# Patient Record
Sex: Female | Born: 1995 | Race: Black or African American | Hispanic: No | Marital: Single | Smoking: Never smoker
Health system: Southern US, Community
[De-identification: ages and names within clinical notes are randomized; demographics above are authoritative.]

---

## 2016-08-07 ENCOUNTER — Encounter (HOSPITAL_COMMUNITY): Payer: Self-pay | Admitting: Emergency Medicine

## 2016-08-07 ENCOUNTER — Ambulatory Visit (HOSPITAL_COMMUNITY)
Admission: EM | Admit: 2016-08-07 | Discharge: 2016-08-07 | Disposition: A | Discharge status: Home / Self Care | Payer: Managed Care, Other (non HMO) | Attending: Emergency Medicine | Admitting: Emergency Medicine

## 2016-08-07 DIAGNOSIS — J029 Acute pharyngitis, unspecified: Secondary | ICD-10-CM | POA: Insufficient documentation

## 2016-08-07 LAB — POCT RAPID STREP A: STREPTOCOCCUS, GROUP A SCREEN (DIRECT): NEGATIVE

## 2016-08-07 NOTE — ED Provider Notes (Signed)
[facility]    CSN: [id number] date & time: 08/07/16  1819     History   Chief Complaint Chief Complaint  Patient presents with  . Sore Throat    HPI Brittany Robbins is a 20 y.o. female.   C/o sore throat and subjective fever for a few days. Denies N/V/D or rash.       History reviewed. No pertinent past medical history.  There are no active problems to display for this patient.   History reviewed. No pertinent surgical history.  OB History    No data available       Home Medications    Prior to Admission medications   Not on File    Family History History reviewed. No pertinent family history.  Social History Social History  Substance Use Topics  . Smoking status: Never Smoker  . Smokeless tobacco: Never Used  . Alcohol use No     Allergies   Patient has no known allergies.   Review of Systems Review of Systems  Constitutional: Positive for fever (subjective). Negative for chills.  HENT: Positive for sore throat. Negative for sinus pain and tinnitus.   Eyes: Negative for redness.  Respiratory: Negative for cough and shortness of breath.   Cardiovascular: Negative for chest pain and palpitations.  Gastrointestinal: Negative for abdominal pain, diarrhea, nausea and vomiting.  Genitourinary: Negative for dysuria, frequency and urgency.  Musculoskeletal: Negative for myalgias.  Skin: Negative for rash.       No lesions  Neurological: Negative for weakness.  Hematological: Does not bruise/bleed easily.  Psychiatric/Behavioral: Negative for suicidal ideas.     Physical Exam Triage Vital Signs ED Triage Vitals  Enc Vitals Group     BP 08/07/16 1840 148/80     Pulse Rate 08/07/16 1840 84     Resp 08/07/16 1840 16     Temp 08/07/16 1840 98.5 F (36.9 C)     Temp Source 08/07/16 1840 Oral     SpO2 08/07/16 1840 97 %     Weight --      Height --      Head Circumference --      Peak Flow --      Pain Score 08/07/16 1856  8     Pain Loc --      Pain Edu? --      Excl. in GC? --    No data found.   Updated Vital Signs BP 148/80 (BP Location: Left Arm)   Pulse 84   Temp 98.5 F (36.9 C) (Oral)   Resp 16   LMP 08/04/2016 (Exact Date)   SpO2 97%   Visual Acuity Right Eye Distance:   Left Eye Distance:   Bilateral Distance:    Right Eye Near:   Left Eye Near:    Bilateral Near:     Physical Exam  Constitutional: She is oriented to person, place, and time. She appears well-developed and well-nourished. No distress.  HENT:  Head: Normocephalic and atraumatic.  Mouth/Throat: Posterior oropharyngeal erythema present. No tonsillar exudate.  Eyes: Conjunctivae and EOM are normal. Pupils are equal, round, and reactive to light. No scleral icterus.  Neck: Normal range of motion. Neck supple. No JVD present. No tracheal deviation present. No thyromegaly present.  Cardiovascular: Normal rate, regular rhythm and normal heart sounds.  Exam reveals no gallop and no friction rub.   No murmur heard. Pulmonary/Chest: Effort normal and breath sounds normal.  Abdominal: Soft. Bowel sounds are normal. She  exhibits no distension. There is no tenderness.  Musculoskeletal: Normal range of motion. She exhibits no edema.  Lymphadenopathy:    She has no cervical adenopathy.  Neurological: She is alert and oriented to person, place, and time. No cranial nerve deficit.  Skin: Skin is warm and dry.  Psychiatric: She has a normal mood and affect. Her behavior is normal. Judgment and thought content normal.  Nursing note and vitals reviewed.    UC Treatments / Results  Labs (all labs ordered are listed, but only abnormal results are displayed) Labs Reviewed  POCT RAPID STREP A    EKG  EKG Interpretation None       Radiology No results found.  Procedures Procedures (including critical care time)  Medications Ordered in UC Medications - No data to display   Initial Impression / Assessment and Plan /  UC Course  I have reviewed the triage vital signs and the nursing notes.  Pertinent labs & imaging results that were available during my care of the patient were reviewed by me and considered in my medical decision making (see chart for details).  Clinical Course     Supportive care.  Throat culture pending  Final Clinical Impressions(s) / UC Diagnoses   Final diagnoses:  Viral pharyngitis    New Prescriptions New Prescriptions   No medications on file     Arnaldo NatalMichael S Sou Nohr, MD 09/05/16 [phone]

## 2016-08-07 NOTE — ED Triage Notes (Signed)
[facility] a [facility] sore [facility] and dysphagia x 2 days. The patient did report a fever at home this am but was not sure of the reading.

## 2016-08-09 ENCOUNTER — Telehealth (HOSPITAL_COMMUNITY): Payer: Self-pay | Admitting: Emergency Medicine

## 2016-08-09 LAB — CULTURE, GROUP A STREP (THRC)

## 2016-08-09 NOTE — Telephone Encounter (Signed)
Pt called back... Gave negative throat culture  Adv pt to take OTC chloraseptic or Cepacol lozenges and ibup/acetaminophen   Adv pt if not getting better to return or f/u w/PCP  Pt verb understating

## 2016-08-09 NOTE — Telephone Encounter (Signed)
Called [phone], no answer or VM Got a message from front staff to return pt's call.  Need to give lab results and to see how pt is doing from recent visit on 11/22 Strep culture was negative.

## 2016-08-11 ENCOUNTER — Encounter (HOSPITAL_COMMUNITY): Payer: Self-pay | Admitting: Emergency Medicine

## 2016-08-11 ENCOUNTER — Ambulatory Visit (HOSPITAL_COMMUNITY)
Admission: EM | Admit: 2016-08-11 | Discharge: 2016-08-11 | Disposition: A | Discharge status: Home / Self Care | Payer: Managed Care, Other (non HMO) | Attending: Family Medicine | Admitting: Family Medicine

## 2016-08-11 DIAGNOSIS — J029 Acute pharyngitis, unspecified: Secondary | ICD-10-CM | POA: Insufficient documentation

## 2016-08-11 DIAGNOSIS — B2710 Cytomegaloviral mononucleosis without complications: Secondary | ICD-10-CM | POA: Diagnosis not present

## 2016-08-11 LAB — POCT RAPID STREP A: Streptococcus, Group A Screen (Direct): NEGATIVE

## 2016-08-11 LAB — POCT INFECTIOUS MONO SCREEN: MONO SCREEN: POSITIVE — AB

## 2016-08-11 NOTE — Discharge Instructions (Signed)
Drink lots of fluids and use lozenges and rest as needed, return if needed.

## 2016-08-11 NOTE — ED Provider Notes (Signed)
[facility]    CSN: [id number] Arrival date & time: 08/11/16  1548     History   Chief Complaint Chief Complaint  Patient presents with  . Sore Throat    HPI Brittany Robbins is a 20 y.o. female.   The history is provided by the patient.  Sore Throat  This is a new problem. The current episode started more than 1 week ago ([facility], sx continue.). The problem has been gradually worsening. The symptoms are aggravated by swallowing.    History reviewed. No pertinent past medical history.  There are no active problems to display for this patient.   History reviewed. No pertinent surgical history.  OB History    No data available       Home Medications    Prior to Admission medications   Not on File    Family History No family history on file.  Social History Social History  Substance Use Topics  . Smoking status: Never Smoker  . Smokeless tobacco: Never Used  . Alcohol use No     Allergies   Patient has no known allergies.   Review of Systems Review of Systems  Constitutional: Negative.  Negative for fever.  HENT: Positive for sore throat. Negative for postnasal drip and rhinorrhea.   Respiratory: Negative.   Cardiovascular: Negative.   Gastrointestinal: Negative.      Physical Exam Triage Vital Signs ED Triage Vitals  Enc Vitals Group     BP 08/11/16 1638 126/68     Pulse Rate 08/11/16 1638 98     Resp 08/11/16 1638 16     Temp 08/11/16 1638 98.2 F (36.8 C)     Temp Source 08/11/16 1638 Temporal     SpO2 08/11/16 1638 100 %     Weight 08/11/16 1637 230 lb (104.3 kg)     Height 08/11/16 1637 5\' 2"  (1.575 m)     Head Circumference --      Peak Flow --      Pain Score 08/11/16 1639 9     Pain Loc --      Pain Edu? --      Excl. in GC? --    No data found.   Updated Vital Signs BP 126/68   Pulse 98   Temp 98.2 F (36.8 C) (Temporal)   Resp 16   Ht 5\' 2"  (1.575 m)   Wt 230 lb (104.3 kg)   LMP  08/04/2016 (Exact Date)   SpO2 100%   BMI 42.07 kg/m   Visual Acuity Right Eye Distance:   Left Eye Distance:   Bilateral Distance:    Right Eye Near:   Left Eye Near:    Bilateral Near:     Physical Exam  Constitutional: She is oriented to person, place, and time. She appears well-developed and well-nourished. No distress.  HENT:  Right Ear: External ear normal.  Left Ear: External ear normal.  Mouth/Throat: Uvula is midline and mucous membranes are normal. Posterior oropharyngeal erythema present. No oropharyngeal exudate or tonsillar abscesses. Tonsils are 3+ on the right. Tonsils are 3+ on the left. No tonsillar exudate.  Neck: Normal range of motion. Neck supple.  Cardiovascular: Normal rate.   Pulmonary/Chest: Effort normal.  Lymphadenopathy:    She has no cervical adenopathy.  Neurological: She is alert and oriented to person, place, and time.  Skin: Skin is warm and dry.  Nursing note and vitals reviewed.    UC Treatments /  Results  Labs (all labs ordered are listed, but only abnormal results are displayed) Labs Reviewed  POCT RAPID STREP A  strep neg Mono pos.  EKG  EKG Interpretation None       Radiology No results found.  Procedures Procedures (including critical care time)  Medications Ordered in UC Medications - No data to display   Initial Impression / Assessment and Plan / UC Course  I have reviewed the triage vital signs and the nursing notes.  Pertinent labs & imaging results that were available during my care of the patient were reviewed by me and considered in my medical decision making (see chart for details).  Clinical Course       Final Clinical Impressions(s) / UC Diagnoses   Final diagnoses:  None    New Prescriptions New Prescriptions   No medications on file     Linna HoffJames D Raylene Carmickle, MD 08/11/16 [id number]

## 2016-08-11 NOTE — ED Triage Notes (Signed)
PT reports severe sore throat for 2 weeks. PT was seen here last week and strep was negative. PT reports symptoms have progressed.

## 2016-08-13 LAB — CULTURE, GROUP A STREP (THRC)

## 2016-12-31 ENCOUNTER — Emergency Department (HOSPITAL_COMMUNITY): Payer: Managed Care, Other (non HMO)

## 2016-12-31 ENCOUNTER — Encounter (HOSPITAL_COMMUNITY): Payer: Self-pay | Admitting: Emergency Medicine

## 2016-12-31 ENCOUNTER — Emergency Department (HOSPITAL_COMMUNITY)
Admission: EM | Admit: 2016-12-31 | Discharge: 2016-12-31 | Disposition: A | Discharge status: Home / Self Care | Payer: Managed Care, Other (non HMO) | Attending: Emergency Medicine | Admitting: Emergency Medicine

## 2016-12-31 DIAGNOSIS — R102 Pelvic and perineal pain: Secondary | ICD-10-CM | POA: Diagnosis not present

## 2016-12-31 LAB — COMPREHENSIVE METABOLIC PANEL
ALT: 12 U/L — ABNORMAL LOW (ref 14–54)
AST: 19 U/L (ref 15–41)
Albumin: 3.6 g/dL (ref 3.5–5.0)
Alkaline Phosphatase: 58 U/L (ref 38–126)
Anion gap: 7 (ref 5–15)
BUN: 11 mg/dL (ref 6–20)
CO2: 26 mmol/L (ref 22–32)
Calcium: 9.1 mg/dL (ref 8.9–10.3)
Chloride: 106 mmol/L (ref 101–111)
Creatinine, Ser: 0.87 mg/dL (ref 0.44–1.00)
GFR calc Af Amer: >60 mL/min (ref >60)
GFR calc non Af Amer: >60 mL/min (ref >60)
Glucose, Bld: 108 mg/dL — ABNORMAL HIGH (ref 65–99)
Potassium: 3.7 mmol/L (ref 3.5–5.1)
Sodium: 139 mmol/L (ref 135–145)
Total Bilirubin: 0.3 mg/dL (ref 0.3–1.2)
Total Protein: 6.3 g/dL — ABNORMAL LOW (ref 6.5–8.1)

## 2016-12-31 LAB — URINALYSIS, ROUTINE W REFLEX MICROSCOPIC
Bacteria, UA: NONE SEEN
Bilirubin Urine: NEGATIVE
GLUCOSE, UA: NEGATIVE mg/dL
Ketones, ur: NEGATIVE mg/dL
Leukocytes, UA: NEGATIVE
Nitrite: NEGATIVE
PH: 5 (ref 5.0–8.0)
Protein, ur: NEGATIVE mg/dL
Specific Gravity, Urine: 1.03 (ref 1.005–1.030)

## 2016-12-31 LAB — CBC WITH DIFFERENTIAL/PLATELET
Basophils Absolute: 0 10*3/uL (ref 0.0–0.1)
Basophils Relative: 0 %
Eosinophils Absolute: 0.1 10*3/uL (ref 0.0–0.7)
Eosinophils Relative: 1 %
HCT: 32.3 % — ABNORMAL LOW (ref 36.0–46.0)
Hemoglobin: 10.1 g/dL — ABNORMAL LOW (ref 12.0–15.0)
Lymphocytes Relative: 35 %
Lymphs Abs: 2.8 10*3/uL (ref 0.7–4.0)
MCH: 22.3 pg — ABNORMAL LOW (ref 26.0–34.0)
MCHC: 31.3 g/dL (ref 30.0–36.0)
MCV: 71.5 fL — ABNORMAL LOW (ref 78.0–100.0)
Monocytes Absolute: 0.5 10*3/uL (ref 0.1–1.0)
Monocytes Relative: 6 %
Neutro Abs: 4.7 10*3/uL (ref 1.7–7.7)
Neutrophils Relative %: 58 %
Platelets: 315 10*3/uL (ref 150–400)
RBC: 4.52 MIL/uL (ref 3.87–5.11)
RDW: 16.1 % — ABNORMAL HIGH (ref 11.5–15.5)
WBC: 8 10*3/uL (ref 4.0–10.5)

## 2016-12-31 LAB — RPR: RPR Ser Ql: NONREACTIVE

## 2016-12-31 LAB — HIV ANTIBODY (ROUTINE TESTING W REFLEX): HIV Screen 4th Generation wRfx: NONREACTIVE

## 2016-12-31 LAB — GC/CHLAMYDIA PROBE AMP (~~LOC~~) NOT AT ARMC
Chlamydia: NEGATIVE
Neisseria Gonorrhea: NEGATIVE

## 2016-12-31 LAB — WET PREP, GENITAL
Clue Cells Wet Prep HPF POC: NONE SEEN
Sperm: NONE SEEN
Trich, Wet Prep: NONE SEEN
Yeast Wet Prep HPF POC: NONE SEEN

## 2016-12-31 LAB — POC URINE PREG, ED: Preg Test, Ur: NEGATIVE

## 2016-12-31 MED ORDER — FERROUS SULFATE 325 (65 FE) MG PO TABS: 325.0000 mg | ORAL_TABLET | Freq: Every day | ORAL | 0 refills | Status: AC

## 2016-12-31 MED ORDER — IBUPROFEN 800 MG PO TABS: 800.0000 mg | ORAL_TABLET | Freq: Three times a day (TID) | ORAL | 0 refills | Status: AC

## 2016-12-31 MED ORDER — DOCUSATE SODIUM 250 MG PO CAPS: 250.0000 mg | ORAL_CAPSULE | Freq: Every day | ORAL | 0 refills | Status: AC

## 2016-12-31 MED ORDER — ACETAMINOPHEN 500 MG PO TABS: 1000.0000 mg | ORAL_TABLET | Freq: Once | ORAL | Status: DC

## 2016-12-31 NOTE — ED Notes (Signed)
Patient transported to Ultrasound 

## 2016-12-31 NOTE — ED Triage Notes (Signed)
Pt presents with generalized abd pain and pelvic pain that woke her from sleep this morning; pt states LPM - now; usually takes advil and pain goes away but not now; pt denies n/v/d, fever, urinary symptoms

## 2016-12-31 NOTE — ED Provider Notes (Signed)
[facility]   CSN: [id number] Arrival date & time: 12/31/16  4098     History   Chief Complaint Chief Complaint  Patient presents with  . Abdominal Pain  . Pelvic Pain    HPI Brittany Robbins is a 21 y.o. female who is previously healthy who presents with acute onset lower abdominal and pelvic pain that woke the patient up around 4 AM. Patient describes the pain is severe cramping with intermittent sharp pains. Patient is currently on her menstrual period started 2 days ago. Patient states she does not normally have pain like this that is unresolved with ibuprofen. Patient took ibuprofen around 4 AM. Patient states her menstrual cycle started a week early. Patient states her period is pretty regular, but heavy. Patient denies any abnormal vaginal bleeding or discharge prior. She denies any fevers, chest pain, shortness of breath, nausea, vomiting, urinary symptoms.  HPI  History reviewed. No pertinent past medical history.  There are no active problems to display for this patient.   History reviewed. No pertinent surgical history.  OB History    No data available       Home Medications    Prior to Admission medications   Medication Sig Start Date End Date Taking? Authorizing Provider  docusate sodium (COLACE) 250 MG capsule Take 1 capsule (250 mg total) by mouth daily. 12/31/16   Emi Holes, PA-C  ferrous sulfate 325 (65 FE) MG tablet Take 1 tablet (325 mg total) by mouth daily. 12/31/16   Emi Holes, PA-C  ibuprofen (ADVIL,MOTRIN) 800 MG tablet Take 1 tablet (800 mg total) by mouth 3 (three) times daily. 12/31/16   Emi Holes, PA-C    Family History History reviewed. No pertinent family history.  Social History Social History  Substance Use Topics  . Smoking status: Never Smoker  . Smokeless tobacco: Never Used  . Alcohol use Yes     Comment: socially     Allergies   Patient has no known allergies.   Review of Systems Review  of Systems  Constitutional: Negative for chills and fever.  HENT: Negative for facial swelling and sore throat.   Respiratory: Negative for shortness of breath.   Cardiovascular: Negative for chest pain.  Gastrointestinal: Negative for abdominal pain, nausea and vomiting.  Genitourinary: Positive for pelvic pain and vaginal bleeding. Negative for dysuria.  Musculoskeletal: Negative for back pain.  Skin: Negative for rash and wound.  Neurological: Negative for headaches.  Psychiatric/Behavioral: The patient is not nervous/anxious.      Physical Exam Updated Vital Signs BP 114/65   Pulse 81   Temp 97.7 F (36.5 C) (Oral)   Resp 16   Ht  (1.6 m)   Wt 104.3 kg   LMP 12/29/2016   SpO2 100%   BMI 40.74 kg/m   Physical Exam  Constitutional: She appears well-developed and well-nourished. No distress.  HENT:  Head: Normocephalic and atraumatic.  Mouth/Throat: Oropharynx is clear and moist. No oropharyngeal exudate.  Eyes: Conjunctivae are normal. Pupils are equal, round, and reactive to light. Right eye exhibits no discharge. Left eye exhibits no discharge. No scleral icterus.  Neck: Normal range of motion. Neck supple. No thyromegaly present.  Cardiovascular: Normal rate, regular rhythm, normal heart sounds and intact distal pulses.  Exam reveals no gallop and no friction rub.   No murmur heard. Pulmonary/Chest: Effort normal and breath sounds normal. No stridor. No respiratory distress. She has no wheezes. She has no rales.  Abdominal:  Soft. Bowel sounds are normal. She exhibits no distension. There is tenderness in the right lower quadrant, suprapubic area and left lower quadrant. There is no rebound and no guarding.    Genitourinary: Uterus is tender. Cervix exhibits no motion tenderness. Right adnexum displays no mass, no tenderness and no fullness. Left adnexum displays no mass, no tenderness and no fullness. There is bleeding in the vagina.  Genitourinary Comments:  Chaperone present  Musculoskeletal: She exhibits no edema.  Lymphadenopathy:    She has no cervical adenopathy.  Neurological: She is alert. Coordination normal.  Skin: Skin is warm and dry. No rash noted. She is not diaphoretic. No pallor.  Psychiatric: She has a normal mood and affect.  Nursing note and vitals reviewed.    ED Treatments / Results  Labs (all labs ordered are listed, but only abnormal results are displayed) Labs Reviewed  WET PREP, GENITAL - Abnormal; Notable for the following:       Result Value   WBC, Wet Prep HPF POC MANY (*)    All other components within normal limits  URINALYSIS, ROUTINE W REFLEX MICROSCOPIC - Abnormal; Notable for the following:    Hgb urine dipstick MODERATE (*)    Squamous Epithelial / LPF 0-5 (*)    All other components within normal limits  CBC WITH DIFFERENTIAL/PLATELET - Abnormal; Notable for the following:    Hemoglobin 10.1 (*)    HCT 32.3 (*)    MCV 71.5 (*)    MCH 22.3 (*)    RDW 16.1 (*)    All other components within normal limits  COMPREHENSIVE METABOLIC PANEL - Abnormal; Notable for the following:    Glucose, Bld 108 (*)    Total Protein 6.3 (*)    ALT 12 (*)    All other components within normal limits  RPR  HIV ANTIBODY (ROUTINE TESTING)  POC URINE PREG, ED  GC/CHLAMYDIA PROBE AMP ([place]) NOT AT [facility]    EKG  EKG Interpretation None       Radiology US Transvaginal Non-ob  Result Date: 12/31/2016 CLINICAL DATA:  Generalized abdominal and pelvic pain. EXAM: TRANSABDOMINAL AND TRANSVAGINAL ULTRASOUND OF PELVIS DOPPLER ULTRASOUND OF OVARIES TECHNIQUE: Both transabdominal and transvaginal ultrasound examinations of the pelvis were performed. Transabdominal technique was performed for global imaging of the pelvis including uterus, ovaries, adnexal regions, and pelvic cul-de-sac. It was necessary to proceed with endovaginal exam following the transabdominal exam to visualize the endometrium. Color and duplex  Doppler ultrasound was utilized to evaluate blood flow to the ovaries. COMPARISON:  None. FINDINGS: Uterus Measurements: 8.2 x 3.4 x 4.8 cm. No fibroids or other mass visualized. Endometrium Thickness: 1.3 mm.  Fluid identified within the endometrial cavity. Right ovary Measurements: 2.9 x 1.5 x 4.1 cm. Normal appearance/no adnexal mass. Left ovary Measurements: 2.5 x 2.5 x 3.1 cm. Normal appearance/no adnexal mass. Pulsed Doppler evaluation of both ovaries demonstrates normal low-resistance arterial and venous waveforms. Other findings No abnormal free fluid. IMPRESSION: Normal pelvic sonogram.  No evidence for ovarian torsion. Electronically Signed   By: Signa Kell M.D.   On: 12/31/2016 09:01   US Pelvis Complete  Result Date: 12/31/2016 CLINICAL DATA:  Generalized abdominal and pelvic pain. EXAM: TRANSABDOMINAL AND TRANSVAGINAL ULTRASOUND OF PELVIS DOPPLER ULTRASOUND OF OVARIES TECHNIQUE: Both transabdominal and transvaginal ultrasound examinations of the pelvis were performed. Transabdominal technique was performed for global imaging of the pelvis including uterus, ovaries, adnexal regions, and pelvic cul-de-sac. It was necessary to proceed with endovaginal exam following  the transabdominal exam to visualize the endometrium. Color and duplex Doppler ultrasound was utilized to evaluate blood flow to the ovaries. COMPARISON:  None. FINDINGS: Uterus Measurements: 8.2 x 3.4 x 4.8 cm. No fibroids or other mass visualized. Endometrium Thickness: 1.3 mm.  Fluid identified within the endometrial cavity. Right ovary Measurements: 2.9 x 1.5 x 4.1 cm. Normal appearance/no adnexal mass. Left ovary Measurements: 2.5 x 2.5 x 3.1 cm. Normal appearance/no adnexal mass. Pulsed Doppler evaluation of both ovaries demonstrates normal low-resistance arterial and venous waveforms. Other findings No abnormal free fluid. IMPRESSION: Normal pelvic sonogram.  No evidence for ovarian torsion. Electronically Signed   By: Signa Kell M.D.   On: 12/31/2016 09:01   [place] Art/ven Flow Abd Pelv Doppler  Result Date: 12/31/2016 CLINICAL DATA:  Generalized abdominal and pelvic pain. EXAM: TRANSABDOMINAL AND TRANSVAGINAL ULTRASOUND OF PELVIS DOPPLER ULTRASOUND OF OVARIES TECHNIQUE: Both transabdominal and transvaginal ultrasound examinations of the pelvis were performed. Transabdominal technique was performed for global imaging of the pelvis including uterus, ovaries, adnexal regions, and pelvic cul-de-sac. It was necessary to proceed with endovaginal exam following the transabdominal exam to visualize the endometrium. Color and duplex Doppler ultrasound was utilized to evaluate blood flow to the ovaries. COMPARISON:  None. FINDINGS: Uterus Measurements: 8.2 x 3.4 x 4.8 cm. No fibroids or other mass visualized. Endometrium Thickness: 1.3 mm.  Fluid identified within the endometrial cavity. Right ovary Measurements: 2.9 x 1.5 x 4.1 cm. Normal appearance/no adnexal mass. Left ovary Measurements: 2.5 x 2.5 x 3.1 cm. Normal appearance/no adnexal mass. Pulsed Doppler evaluation of both ovaries demonstrates normal low-resistance arterial and venous waveforms. Other findings No abnormal free fluid. IMPRESSION: Normal pelvic sonogram.  No evidence for ovarian torsion. Electronically Signed   By: Signa Kell M.D.   On: 12/31/2016 09:01    Procedures Procedures (including critical care time)  Medications Ordered in ED Medications  acetaminophen (TYLENOL) tablet 1,000 mg (not administered)     Initial Impression / Assessment and Plan / ED Course  I have reviewed the triage vital signs and the nursing notes.  Pertinent labs & imaging results that were available during my care of the patient were reviewed by me and considered in my medical decision making (see chart for details).     Patient with probable severe menstrual cramps. Patient's symptoms resolving in the ED without intervention. Patient given Tylenol for residual pain. CBC  shows hemoglobin 10.1, microcytic anemia. CMP shows glucose 108, protein 6.3, ALT 12. GC/chlamydia, HIV, RPR sent. Wet prep shows many WBCs. Urine pregnancy negative. Urine shows moderate hematuria, however patient is currently on the initial cycle. Pelvic ultrasound normal, no evidence for torsion or other findings. Doubt any other emergent etiology. Pain mostly localized to suprapubic area with pelvic exam exhibiting uterine tenderness, but no adnexal tenderness. No CMT concerning for PID. No back pain or urinary symptoms indicating kidney stone or ureteral stone. Follow-up to OB/GYN. Discharge home with ibuprofen. Advised patient can alternate with Tylenol. Also initiated iron supplementation with Colace. Strict return precautions discussed. Patient understands and agrees with plan. Patient vitals stable throughout ED course and discharged in satisfactory condition. I discussed patient case with Dr. Patria Mane who got the patient's management and agrees with plan.  Final Clinical Impressions(s) / ED Diagnoses   Final diagnoses:  Pelvic pain in female    New Prescriptions New Prescriptions   DOCUSATE SODIUM (COLACE) 250 MG CAPSULE    Take 1 capsule (250 mg total) by mouth daily.   FERROUS  SULFATE 325 (65 FE) MG TABLET    Take 1 tablet (325 mg total) by mouth daily.   IBUPROFEN (ADVIL,MOTRIN) 800 MG TABLET    Take 1 tablet (800 mg total) by mouth 3 (three) times daily.     Emi Holes, PA-C 12/31/16 1005    Azalia Bilis, MD 01/01/17 1550

## 2016-12-31 NOTE — Discharge Instructions (Signed)
Medications: Ibuprofen, ferrous sulfate, Colace  Treatment: Take ibuprofen every 8 hours. You can alternate with Tylenol as prescribed over-the-counter. Take iron (ferrous sulfate) once daily as prescribed. Take Colace with this medication to help prevent constipation. Iron may make your stool darker than normal. You will be called in 2-3 days if you test positive for any sexual transmitted diseases. You will need to follow-up with an OB/GYN or the health department for treatment if positive. You should also tell your sexual partners of any positive results as well so they can be treated.  Follow-up: Please follow-up and establish care with the women's outpatient clinic or another OB/GYN (see circled number outlined in black on your discharge paperwork for a number to help you find a doctor) for further evaluation and treatment. Please return to emergency department if you develop any new or worsening symptoms.

## 2017-04-27 IMAGING — US US PELVIS COMPLETE
1 series · 14 of 25 positions shown · non-contrast
Comparison: None.

CLINICAL DATA: Generalized abdominal and pelvic pain.

EXAM:
TRANSABDOMINAL AND TRANSVAGINAL ULTRASOUND OF PELVIS
DOPPLER ULTRASOUND OF OVARIES
TECHNIQUE: Both transabdominal and transvaginal ultrasound examinations of the
pelvis were performed. Transabdominal technique was performed for
global imaging of the pelvis including uterus, ovaries, adnexal
regions, and pelvic cul-de-sac.
It was necessary to proceed with endovaginal exam following the
transabdominal exam to visualize the endometrium. Color and duplex
Doppler ultrasound was utilized to evaluate blood flow to the
ovaries.

[Series 1: us pelvis complete · 0.21mm/px · 14 of 57 slices shown]
[im 1/57]
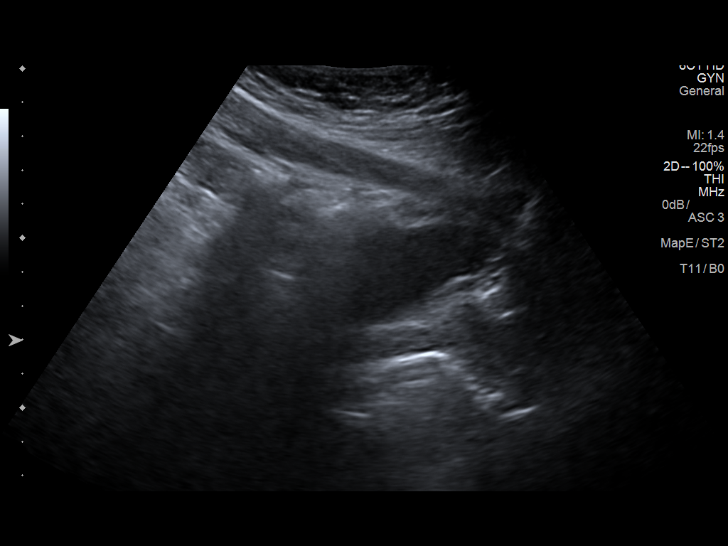
[im 5/57]
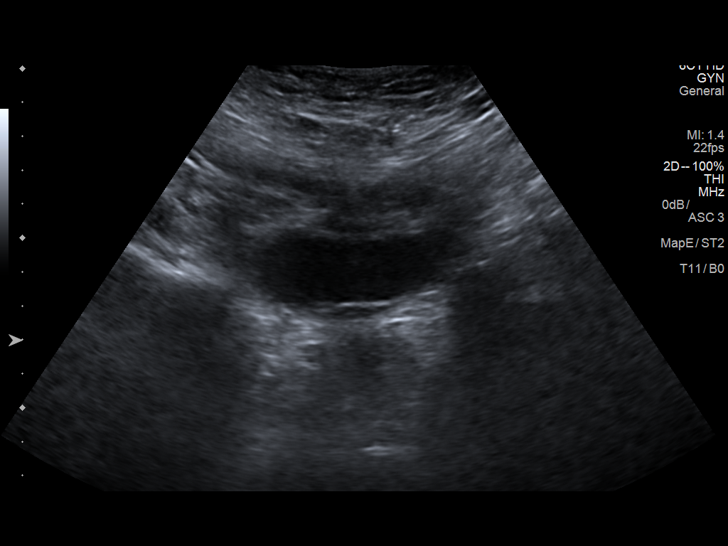
[im 10/57]
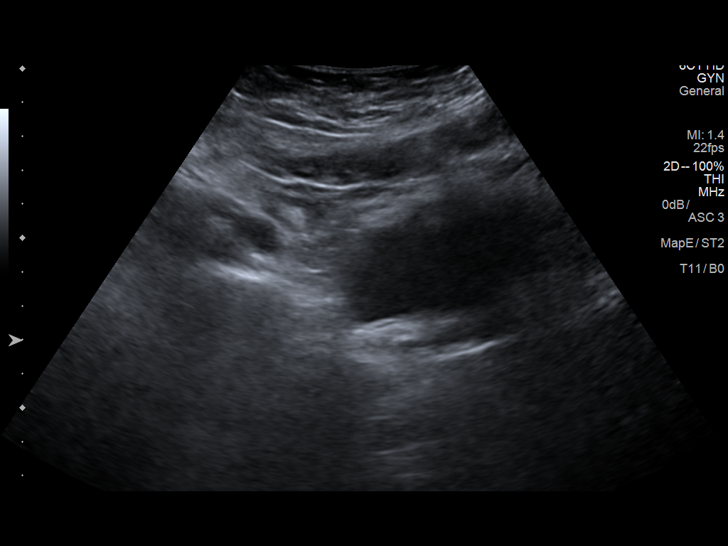
[im 15/57]
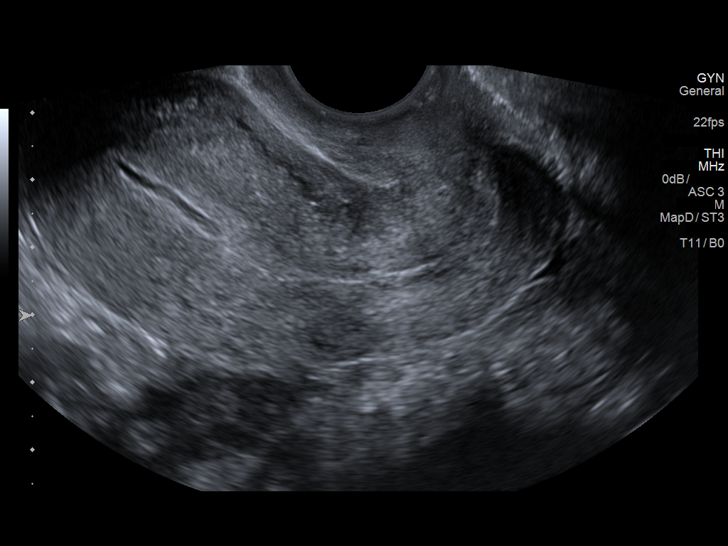
[im 19/57]
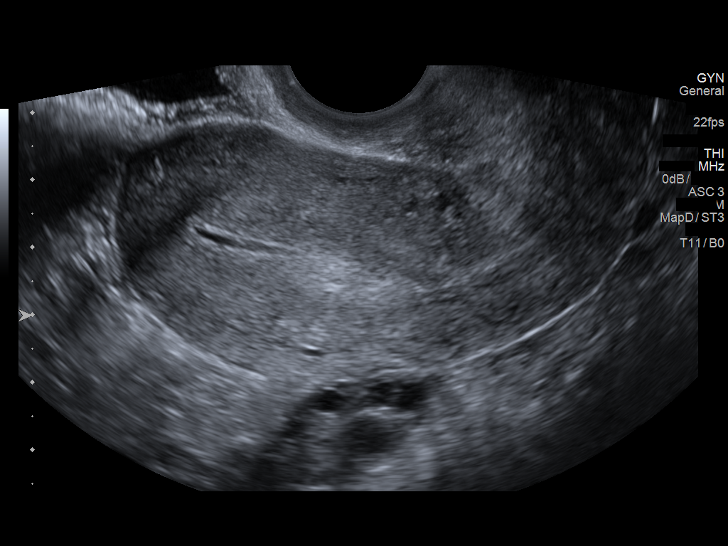
[im 22/57]
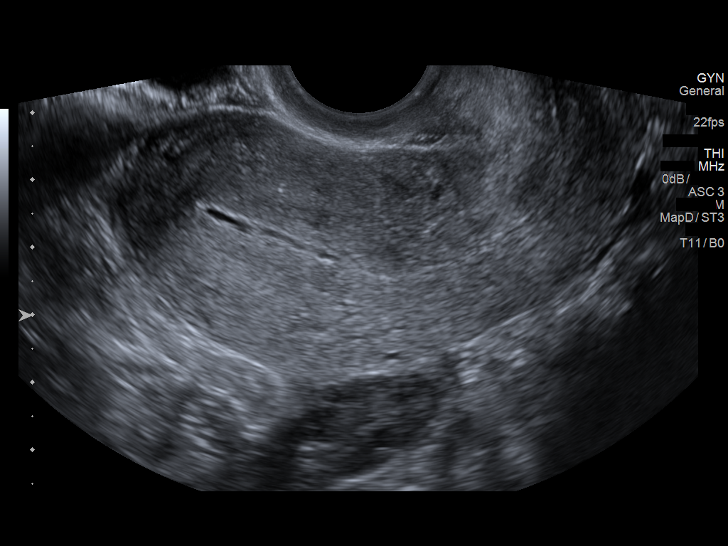
[im 26/57]
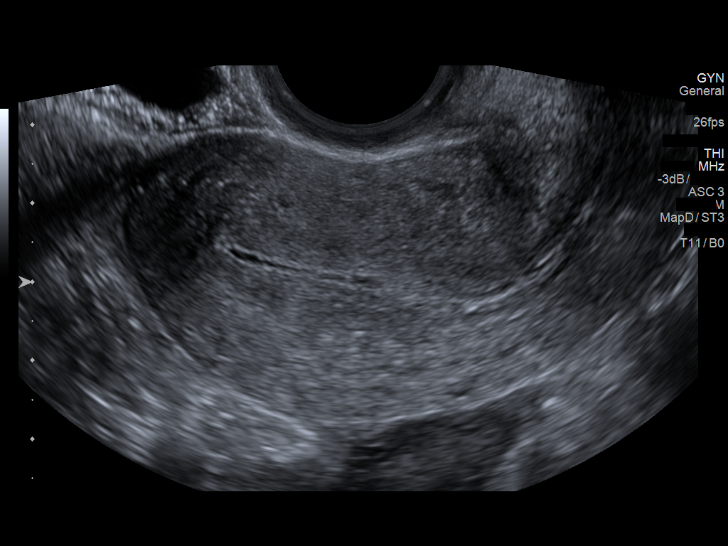
[im 31/57]
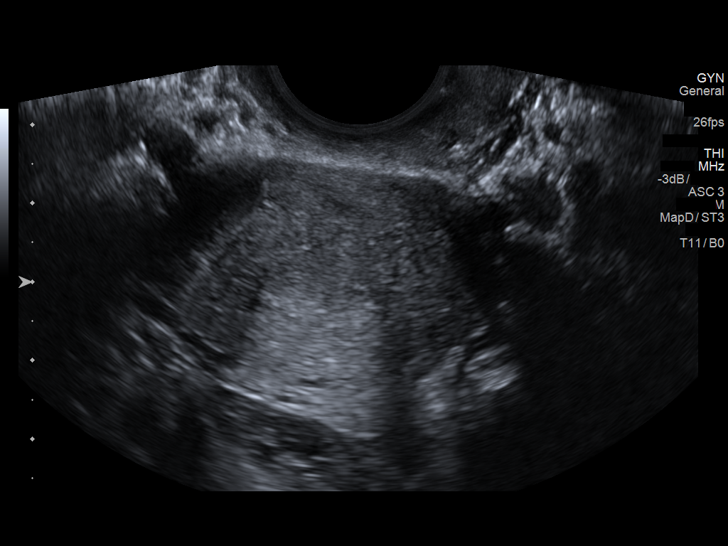
[im 36/57]
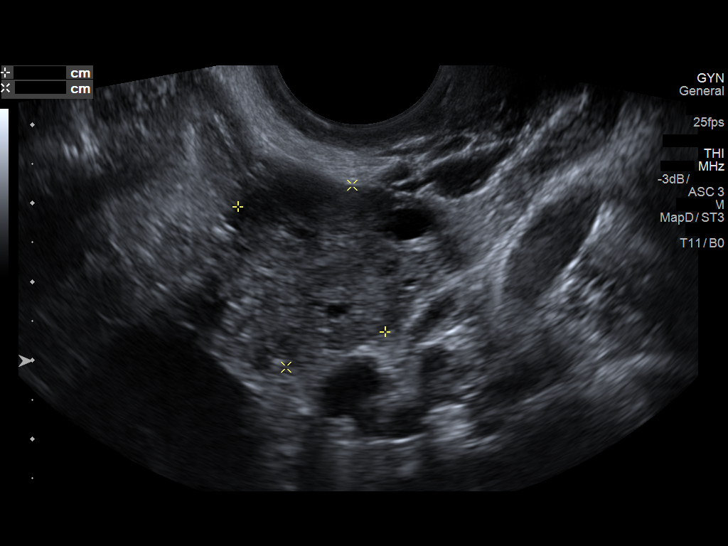
[im 38/57]
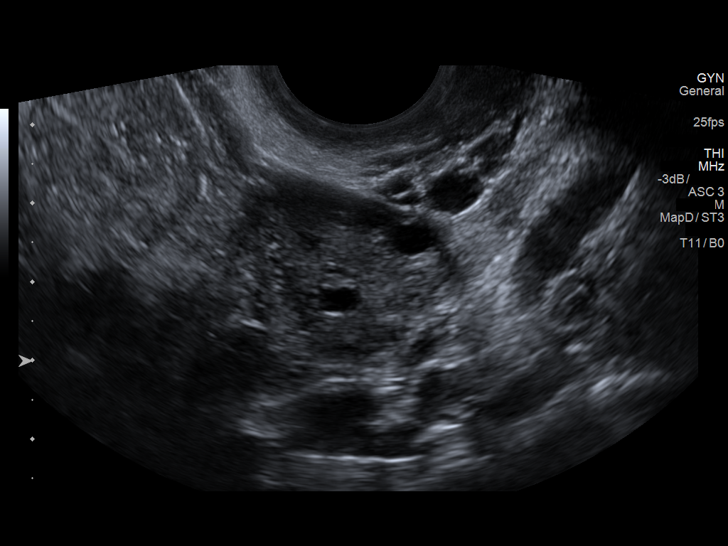
[im 43/57]
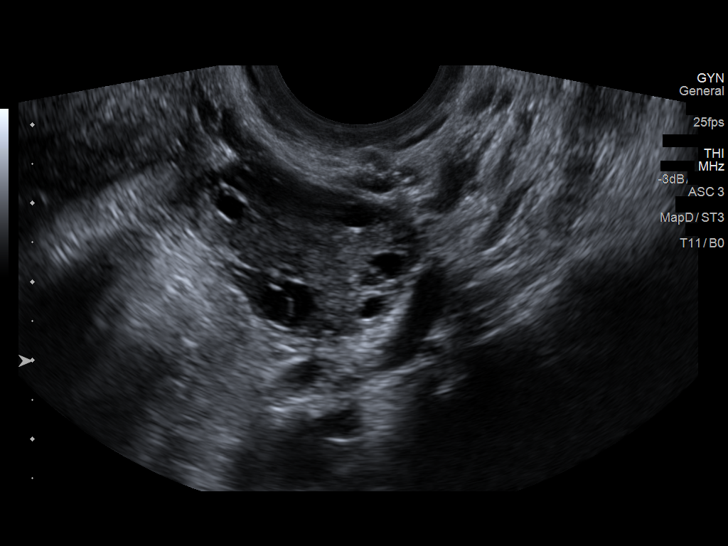
[im 47/57]
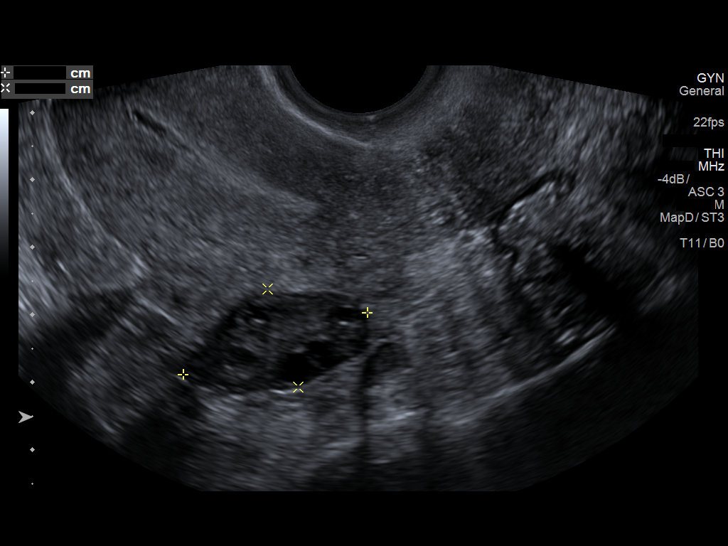
[im 52/57]
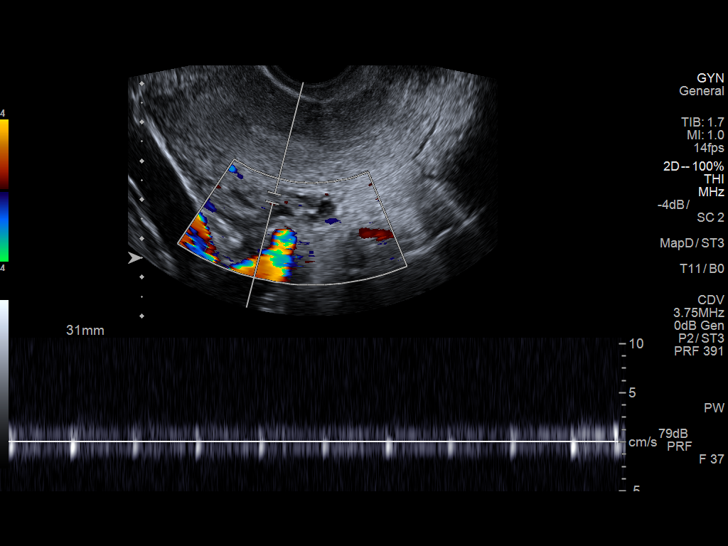
[im 57/57]
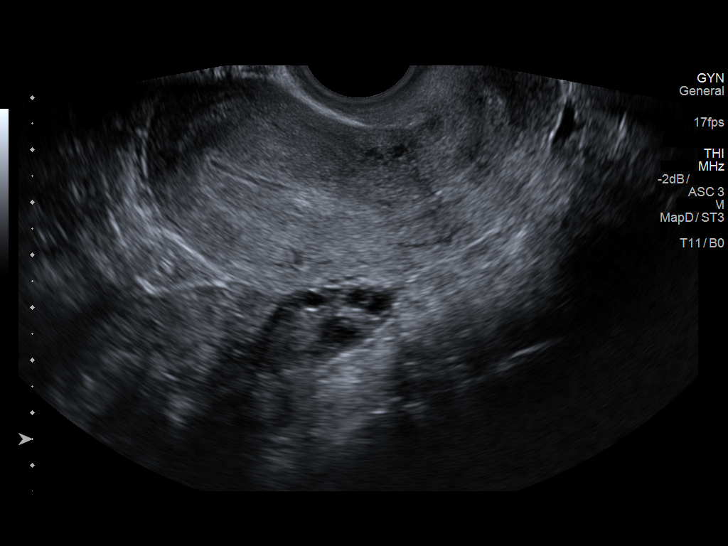

[14 of 25 positions shown; findings below may reference images not displayed]

FINDINGS: Uterus

Measurements: 8.2 x 3.4 x 4.8 cm. No fibroids or other mass
visualized.

Endometrium

Thickness: 1.3 mm.  Fluid identified within the endometrial cavity.

Right ovary

Measurements: 2.9 x 1.5 x 4.1 cm. Normal appearance/no adnexal mass.

Left ovary

Measurements: 2.5 x 2.5 x 3.1 cm. Normal appearance/no adnexal mass.

Pulsed Doppler evaluation of both ovaries demonstrates normal
low-resistance arterial and venous waveforms.

Other findings

No abnormal free fluid.
IMPRESSION: Normal pelvic sonogram.  No evidence for ovarian torsion.
